# Patient Record
Sex: Male | Born: 1975 | Race: White | Hispanic: No | Marital: Single | Smoking: Former smoker
Health system: Southern US, Community
[De-identification: ages and names within clinical notes are randomized; demographics above are authoritative.]

## PROBLEM LIST (undated history)

## (undated) DIAGNOSIS — F200 Paranoid schizophrenia: Secondary | ICD-10-CM

## (undated) HISTORY — PX: ORBITAL FRACTURE SURGERY: SHX725

## (undated) HISTORY — PX: CHOLECYSTECTOMY: SHX55

---

## 2007-09-16 ENCOUNTER — Other Ambulatory Visit: Payer: Self-pay

## 2007-09-16 ENCOUNTER — Ambulatory Visit: Payer: Self-pay | Admitting: Psychiatry

## 2007-09-16 ENCOUNTER — Inpatient Hospital Stay (HOSPITAL_COMMUNITY): Admission: AD | Admit: 2007-09-16 | Discharge: 2007-09-24 | Payer: Self-pay | Admitting: Psychiatry

## 2008-12-28 ENCOUNTER — Inpatient Hospital Stay (HOSPITAL_COMMUNITY): Admission: AD | Admit: 2008-12-28 | Discharge: 2009-01-03 | Payer: Self-pay | Admitting: Psychiatry

## 2008-12-28 ENCOUNTER — Ambulatory Visit: Payer: Self-pay | Admitting: Psychiatry

## 2009-05-29 ENCOUNTER — Emergency Department (HOSPITAL_COMMUNITY): Admission: EM | Admit: 2009-05-29 | Discharge: 2009-05-29 | Payer: Self-pay | Admitting: Emergency Medicine

## 2010-09-17 IMAGING — CR DG CHEST 1V PORT
1 series · 1 of 1 positions shown · non-contrast
Comparison: None.

CLINICAL DATA: Intoxication, vomiting, unresponsive

PORTABLE CHEST - 1 VIEW

[view not recorded]
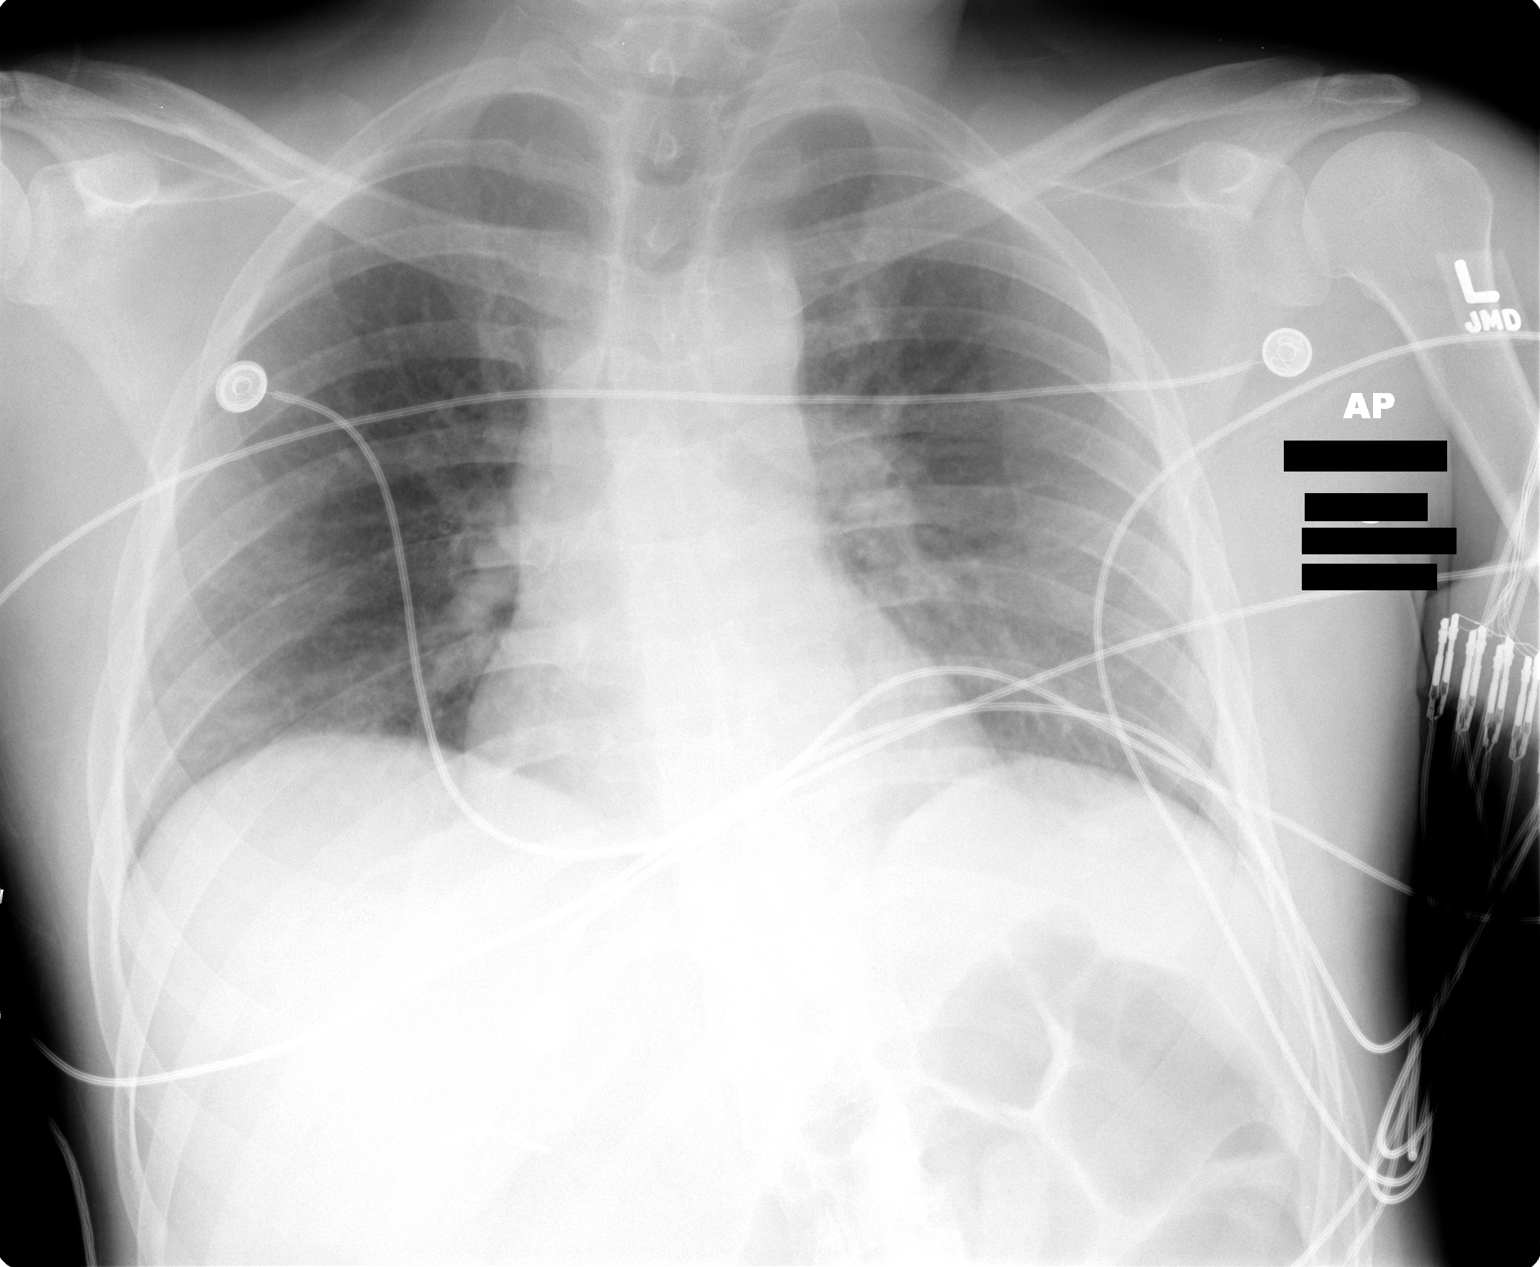

[1 of 1 positions shown; findings below may reference images not displayed]

FINDINGS: Low lung volumes with vascular crowding and basilar
atelectasis.  No focal pneumonia, consolidation, effusion or
pneumothorax.  Previous cholecystectomy.
IMPRESSION: Low volume exam with atelectasis

## 2011-01-27 LAB — COMPREHENSIVE METABOLIC PANEL
ALT: 48 U/L (ref 0–53)
AST: 49 U/L — ABNORMAL HIGH (ref 0–37)
Albumin: 4 g/dL (ref 3.5–5.2)
Alkaline Phosphatase: 87 U/L (ref 39–117)
BUN: 2 mg/dL — ABNORMAL LOW (ref 6–23)
CO2: 24 mEq/L (ref 19–32)
Calcium: 8.2 mg/dL — ABNORMAL LOW (ref 8.4–10.5)
Chloride: 102 mEq/L (ref 96–112)
Creatinine, Ser: 0.82 mg/dL (ref 0.4–1.5)
GFR calc Af Amer: >60 mL/min (ref >60)
GFR calc non Af Amer: >60 mL/min (ref >60)
Glucose, Bld: 132 mg/dL — ABNORMAL HIGH (ref 70–99)
Potassium: 3.2 mEq/L — ABNORMAL LOW (ref 3.5–5.1)
Sodium: 136 mEq/L (ref 135–145)
Total Bilirubin: 0.6 mg/dL (ref 0.3–1.2)
Total Protein: 6.8 g/dL (ref 6.0–8.3)

## 2011-01-27 LAB — RAPID URINE DRUG SCREEN, HOSP PERFORMED
Amphetamines: NOT DETECTED
Barbiturates: NOT DETECTED
Benzodiazepines: NOT DETECTED
Cocaine: NOT DETECTED
Opiates: NOT DETECTED
Tetrahydrocannabinol: NOT DETECTED

## 2011-01-27 LAB — ETHANOL: Alcohol, Ethyl (B): 248 mg/dL — ABNORMAL HIGH (ref 0–10)

## 2011-02-01 LAB — CBC
MCHC: 34.4 g/dL (ref 30.0–36.0)
MCV: 88.2 fL (ref 78.0–100.0)
Platelets: 245 10*3/uL (ref 150–400)
RBC: 5.08 MIL/uL (ref 4.22–5.81)
WBC: 7.7 10*3/uL (ref 4.0–10.5)

## 2011-02-01 LAB — URINALYSIS, ROUTINE W REFLEX MICROSCOPIC
Hgb urine dipstick: NEGATIVE
Nitrite: NEGATIVE
Protein, ur: NEGATIVE mg/dL
Specific Gravity, Urine: 1.006 (ref 1.005–1.030)
Urobilinogen, UA: 0.2 mg/dL (ref 0.0–1.0)

## 2011-02-01 LAB — COMPREHENSIVE METABOLIC PANEL
ALT: 31 U/L (ref 0–53)
AST: 22 U/L (ref 0–37)
Albumin: 3.9 g/dL (ref 3.5–5.2)
CO2: 28 mEq/L (ref 19–32)
Calcium: 9.4 mg/dL (ref 8.4–10.5)
Chloride: 107 mEq/L (ref 96–112)
Creatinine, Ser: 0.84 mg/dL (ref 0.4–1.5)
GFR calc Af Amer: >60 mL/min (ref >60)
GFR calc non Af Amer: >60 mL/min (ref >60)
Sodium: 141 mEq/L (ref 135–145)

## 2011-02-01 LAB — DRUGS OF ABUSE SCREEN W/O ALC, ROUTINE URINE
Cocaine Metabolites: NEGATIVE
Creatinine,U: 46.7 mg/dL
Marijuana Metabolite: NEGATIVE
Opiate Screen, Urine: NEGATIVE
Propoxyphene: NEGATIVE

## 2011-03-06 NOTE — H&P (Signed)
NAMEMANOAH, [name]                  ACCOUNT NO.:  [id number]   MEDICAL RECORD NO.:  [id number]          PATIENT TYPE:  IPS   LOCATION:  0404                          FACILITY:  [facility]   PHYSICIAN:  Xavier Jungling, MD  DATE OF BIRTH:  20-Jul-1976   DATE OF ADMISSION:  09/16/2007  DATE OF DISCHARGE:                       PSYCHIATRIC ADMISSION ASSESSMENT   IDENTIFICATION:  35 year old white male who is single.  This is an  involuntary admission.   HISTORY OF PRESENT ILLNESS:  This patient was petitioned by his mother  who was concerned about symptoms of paranoia and had complained of him  abusing substances including whiteout and ether, which he has  acknowledged doing.  The patient is single and living alone with his  mother here in [place], [place]. Has a lengthy history of  polysubstance abuse and was first diagnosed with schizophrenia in 2004  in [place].  He has a history of polysubstance abuse starting  with alcohol around age 89.  He denies any hallucinations today and has  been cooperative with staff.   PAST PSYCHIATRIC HISTORY:  The patient is followed by Dr. Hortencia Wilson at  [facility] mental health and by the ACT Team, previously diagnosed  schizophrenia, paranoid type when he was in the [place],  has a  history of polysubstance abuse, history of paranoia and hallucinations.  He reports he is currently compliant with medications.   SOCIAL HISTORY:  Single white male on disability currently living with  his mother.   MEDICAL HISTORY:  Primary care [name] is not known.  Medical problems  are no chronic medical problems.  He was notably hypokalemic 3.1 in the  emergency room which was repleted with 40 mEq of K-Dur.   CURRENT MEDICATIONS:  Are Seroquel 100 mg p.o. q.h.s., Risperdal 1 mg at  2:00 p.m., 5 mg p.o. q.h.s., Flexeril 10 mg 1/2 tablet b.i.d. p.r.n. for  muscle spasms.   PHYSICAL EXAM:  Done in the emergency room.   DIAGNOSTIC  STUDIES:  Unremarkable other than his potassium at 3.1.  His  urine drug screen was negative for all substances.   MENTAL STATUS EXAM:  Revealed a fully alert gentleman cooperative,  bright affect and not guarded, somewhat resistant to interview and gave  some contradictory responses regarding his compliance.  At one point  denied current drug abuse and then admitted that he was periodically  using ether and other substances whenever he could get them but was  vague about when last used.  No overtly delusional statements.  Mood is  neutral, accepts being here and has been cooperative.  Cognition is  preserved today, fully alert, oriented x4.  Insight seems adequate.  Reports he is here because his mother insisted on it.   AXIS I:  Psychosis NOS.  Polysubstance abuse.  AXIS II:  No diagnosis.  AXIS III:  No diagnosis.  AXIS IV:  Deferred.  AXIS V:  Current 49 past year not known postop hyponatremia improved  also.  No postop hypokalemia improved.   PLAN:  Is to  involuntarily admit the patient with q.  15-minute checks  in place.  Will continue his usual medications and have placed him on  our intensive care unit for close observation.  We will attempt to get  additional history from his family.   Estimated length of stay is 3-4 days.      [name], N.P.      Xavier Jungling, MD  Electronically Signed    MAS/MEDQ  D:  09/17/2007  T:  09/17/2007  Job:  [id number]

## 2011-03-06 NOTE — H&P (Signed)
[name], [name]                  ACCOUNT NO.:  [id number]   MEDICAL RECORD NO.:  [id number]          PATIENT TYPE:  IPS   LOCATION:  0508                          FACILITY:  BH   PHYSICIAN:  Anselm Jungling, MD  DATE OF BIRTH:  1976-07-28   DATE OF ADMISSION:  12/28/2008  DATE OF DISCHARGE:                       PSYCHIATRIC ADMISSION ASSESSMENT   The patient is a 35 year old male voluntarily admitted.   HISTORY OF PRESENT ILLNESS:  The patient presents with a history of  altered mental status.  He was recommended to come here by his ACT team  psychiatrist after the patient had been using a substance called K2, a  synthetic marijuana, that the patient states you spray on any kind of  herb.  He states he has been high for the past 5 days.  He states he  likes the way this substance works for him, gives him a lot of energy.  He feels like he is less confused than he has been.  He denies any  suicidal or homicidal thoughts.  He denies any hallucinations at this  time.   PAST PSYCHIATRIC HISTORY:  The patient was here in 2008 for paranoia and  substance use.  He sees Dr. Electa Sniff on the ACT team.   SOCIAL HISTORY:  A 35 year old male who lives with his parents in  [place], [place].  He has 14 years of schooling.  He was  studying botany.  He is unemployed and has a past history of being in  prison for smuggling drugs.   FAMILY HISTORY:  None.   ALCOHOL AND DRUG HISTORY:  The patient smokes cigarettes and has been  using a substance called K2 recently.  He has a history of marijuana use  and cocaine use in the past.   PRIMARY CARE Jlynn Ly:  Theressa Millard, M.D.   MEDICAL PROBLEMS:  He denies any acute chronic health issues.  No major  medical problems.   MEDICATIONS:  1. Has been on Risperdal 3 mg b.i.d.  2. Cogentin 0.5 mg b.i.d.  (Reports compliance with his medications.  States that his mother  administers his medications to him.)   DRUG ALLERGIES:  No known  allergies.   PHYSICAL EXAMINATION:  GENERAL:  This is a young, well-nourished male in  no acute distress.  VITAL SIGNS:  His temperature is 97.2, heart rate 72, respirations 16,  blood pressure is 110/74.  He is 5 feet 7 inches tall, 166 pounds.  HEENT:  Head is atraumatic.  Negative lymphadenopathy.  CHEST:  Clear.  BREAST EXAM:  Deferred.  HEART:  Regular rate and rhythm.  No murmurs, gallops or rubs.  ABDOMEN:  Soft, flat, nontender, nondistended abdomen.  PELVIC/GU:  Exam deferred.  EXTREMITIES:  Moves all extremities, 5+ against resistance.  SKIN:  Warm and dry.  NEUROLOGIC:  Intact and nonfocal.   MENTAL STATUS EXAM:  This is a fully alert, cooperative male who is  casually dressed.  He has fair eye contact.  His speech is soft-spoken,  polite and normal pace.  Mood is neutral.  Affect - again, the patient  is polite.  He sometimes appears to have some inappropriate smiling, as  if he is internally distracted, although he denies any hallucinations.  Thought processes are somewhat scattered.  He talks about his use of  substances.  Offers information about substances.  No delusional  statements.  Cognitive function intact.  His memory appears intact.  Judgment is fair.  Insight is minimal.  The patient seems to imply that  he was still using his substance after discharge.   LABORATORY DATA:  CMET within normal limits.  CBC within normal limits.  Urine drug screen is negative.  Urinalysis is negative.   IMPRESSION:  AXIS I:  Schizophrenia, paranoid type, and substance use.  AXIS II:  Deferred.  AXIS III:  No known medical conditions.  AXIS IV:  Psychosocial problems related to chronic substance use.  Problems with occupation and possible problems with education.  AXIS V:  Current is 50.   PLAN:  Plan is to put the patient in the dual diagnosis group.  Will  resume his Risperdal and Cogentin.  Will have a family session with his  mother for baseline and discharge concerns.   Work on relapse prevention,  as well.  His tentative length of stay at this time is 2-3 days.      [name], N.P.      Anselm Jungling, MD  Electronically Signed    JO/MEDQ  D:  12/29/2008  T:  12/29/2008  Job:  [id number]

## 2011-03-09 NOTE — Discharge Summary (Signed)
NAMEABSHIR, PAOLINI NO.:  [id number]   MEDICAL RECORD NO.:  [id number]          PATIENT TYPE:  IPS   LOCATION:  0508                          FACILITY:  BH   PHYSICIAN:  Anselm Jungling, MD  DATE OF BIRTH:  08/13/1976   DATE OF ADMISSION:  12/28/2008  DATE OF DISCHARGE:  01/03/2009                               DISCHARGE SUMMARY   IDENTIFYING DATA/REASON FOR ADMISSION:  This is the third BAC admission  for [name], 35 year old single Caucasian male who lives with his mother  in [place], [place].  He is a client of the [facility]  assertive community treatment team.  He comes to [place] with a history of  major mental illness, including psychotic disorder, and history of  substance abuse.  He is admitted on this occasion because of substance  abuse relapse and associated decline in functioning.  Please refer to  the admission note for further details pertaining to the symptoms,  circumstances and history that led to his hospitalization.  He was given  an initial Axis I diagnosis of schizophrenia, chronic undifferentiated  type, acute exacerbation and polysubstance abuse.   MEDICAL AND LABORATORY:  The patient is in good health without any  active or chronic medical problems.  He was medically and physically  assessed by the psychiatric nurse practitioner.  There were no  significant medical issues.   HOSPITAL COURSE:  The patient was admitted to the adult inpatient  psychiatric service.  He presented as a well-nourished, normally-  developed adult male who readily admitted to recent use of supposedly  legal substance known as K2, which is apparently a form of non-  psychotropic herbs that are treated or spiked with a cannabinol  synthetic.  Jeffey admitted to using this during the week previously.  He had also been consuming some alcohol.  As a result, he had been  spending increasing amounts of time sleeping, was unable to function,  which was readily  apparent to his mother.   Nunzio had previously been on a regimen of Risperdal 3 mg daily, and  Cogentin 0.5 mg twice daily.  He had been compliant with this medication  prior to admission.   The patient was involved in the therapeutic milieu, and he attended  various therapeutic groups and activities.  He gave consent to  involvement of both his mother and father (divorced), including a family  meeting that occurred during his stay.   The patient also gave consent to the treatment team communicating with  the assertive community treatment team, [facility] outpatient [facility]  group.   Reinhard indicated in the early part of his stay that he still felt high  from the substance that he had been using, although overtly, his speech  was normally organized and his mood was neutral.  He simply looked very  tired.  He rationalized the drug use he had been doing by stating that  this was a substance that was legal.  He was able to acknowledge  reluctantly that it was not in his best interest to continue using this  substance.   He was also able to see readily that his having relapsed on even of  legal substance was something that alarmed his parents, and damaged  trust, that he had recently been working to rebuild with them.   Once hospitalized, his parents felt it necessary to explore Steffon's  room at home, and in doing so, they found evidence of him having a  firearm.  Cheston later admitted to having bought this firearm  surreptitiously with money that he had saved.  He also bought a  significant amount of ammunition, and had been going to a local gun  range where he was practicing target shooting.  All of these matters  were discussed at length in the family meeting.   At the time of discharge, a new living situation had been determined for  Doctor, namely, living with his father and stepmother, at least for the  time-being.  Izak was to continue his follow-up with the assertive   community treatment team and medications as he had been taking  previously.  He agreed not to use any illicit substances, legal or  illegal.   AFTERCARE:  As above.  The assertive community treatment team was to  begin resuming their work with them on the day of discharge, January 03, 2009 at 11:00 a.m..   DISCHARGE MEDICATIONS:  1. Risperdal 3 mg at q.h.s.  2. Cogentin 0.5 mg b.i.d.   DISCHARGE DIAGNOSES:  AXIS I:  Schizophrenia, chronic undifferentiated  type, and substance abuse NOS.  AXIS II:  Deferred.  AXIS III:  No acute or chronic illnesses.  AXIS IV:  Stressors severe.  AXIS V:  GAF on discharge 50.      Anselm Jungling, MD  Electronically Signed     SPB/MEDQ  D:  01/04/2009  T:  01/04/2009  Job:  [phone]

## 2011-03-09 NOTE — Discharge Summary (Signed)
NAMEMARKEISE, MATHEWS                  ACCOUNT NO.:  [id number]   MEDICAL RECORD NO.:  [id number]          PATIENT TYPE:  IPS   LOCATION:  0404                          FACILITY:  BH   PHYSICIAN:  Anselm Jungling, MD  DATE OF BIRTH:  March 22, 1976   DATE OF ADMISSION:  09/16/2007  DATE OF DISCHARGE:  09/24/2007                               DISCHARGE SUMMARY   IDENTIFYING DATA AND REASON FOR ADMISSION:  This was an inpatient  psychiatric admission for [name], a 35 year old single white male.  He  had been petitioned by his mother concerning increasing symptoms of  paranoia, associated with substance abuse.  Please refer to the  admission note for further details pertaining to the symptoms,  circumstances and history that led to his hospitalization.  He was given  an initial Axis I diagnosis of psychosis NOS and polysubstance abuse.   MEDICAL AND LABORATORY:  The patient was in good health without any  active or chronic medical problems.  He was medically and physically  assessed by the psychiatric nurse practitioner.  His urine drug screen  was negative.  His initial potassium was somewhat low at 3.1, but this  was not felt to be reflective of any underlying physiological or medical  disturbance.  He had a history of muscle spasm and had been taking  Flexeril for this.  There were no acute medical issues.   HOSPITAL COURSE:  The patient was admitted to the adult inpatient  psychiatric service.  He presented as a well-nourished, well-developed  gentleman with bright affect, who was fairly open, but gave some  contradictory responses regarding his previous treatment compliance.  He  was also contradictory regarding recent drug abuse.  He eventually  admitted to huffing various inhalant substances.   He came to [place] with a long history of cannabis abuse.  He also has a  history of prior psychiatric hospitalizations in other states, with  previous diagnoses of schizophrenia.   The patient  was treated with a regimen of Risperdal and Seroquel.  He  was not a particularly good participant in the treatment program,  especially initially, during which time he spent most of his time in  bed.  However, he was generally pleasant and cooperative.   A good deal of effort was directed towards establishing a reasonable  aftercare plan for [place].  He had only recently returned to this  geographic area, where his mother lives.  It was not clear whether he  could live with his mother, due to his general level of impulsivity, and  his tendency to quickly get reinvolved in substance abuse.  However, the  patient ultimately agreed to a plan in which he would be followed by the  Assertive Community Treatment team, while living with his mother.   The patient's mental status cleared quite well during the course of his  inpatient stay, and at the time of discharge, he was absent of any overt  signs of psychosis or thought disorder.  His mood was generally good,  pleasant and upbeat.  He had some positive contacts with  his mother, who  was enormously supportive.  He agreed to the following aftercare plan.   AFTERCARE:  The patient was to follow-up with the Assertive Community  Treatment team of [place], in conjunction with the [facility].  The patient was to be seen on the day following discharge, September 25, 2007, by the ACT team.   DISCHARGE MEDICATIONS:  Risperdal 1 mg at noon, and 5 mg q.h.s.,  Seroquel 100 mg q.h.s., and Flexeril 5 mg up to b.i.d. as needed for  muscle spasm.  The patient was instructed to follow-up with his primary  care physician for routine medical care.   DISCHARGE DIAGNOSES:  AXIS I: Psychosis, not otherwise specified,  resolving. History of substance abuse, not otherwise specified.  AXIS II: Deferred.  AXIS III: No acute or chronic illnesses.  AXIS IV: Stressors severe.  AXIS V: GAF on discharge 45.      Anselm Jungling, MD  Electronically  Signed     SPB/MEDQ  D:  10/09/2007  T:  10/10/2007  Job:  [id number]

## 2011-07-31 LAB — COMPREHENSIVE METABOLIC PANEL
AST: 11
CO2: 28
Calcium: 8.8
Creatinine, Ser: 0.81
GFR calc Af Amer: >60
GFR calc non Af Amer: >60
Sodium: 140
Total Protein: 6

## 2011-07-31 LAB — RAPID URINE DRUG SCREEN, HOSP PERFORMED
Amphetamines: NOT DETECTED
Cocaine: NOT DETECTED
Opiates: NOT DETECTED
Tetrahydrocannabinol: NOT DETECTED

## 2011-07-31 LAB — DIFFERENTIAL
Eosinophils Relative: 2
Lymphocytes Relative: 40
Lymphs Abs: 2.5
Monocytes Relative: 9

## 2011-07-31 LAB — URINALYSIS, ROUTINE W REFLEX MICROSCOPIC
Bilirubin Urine: NEGATIVE
Ketones, ur: NEGATIVE
Nitrite: NEGATIVE
Protein, ur: NEGATIVE
Urobilinogen, UA: 0.2

## 2011-07-31 LAB — CBC
MCHC: 35.5
MCV: 86.8
Platelets: 233
RDW: 13.5

## 2013-01-20 ENCOUNTER — Encounter (HOSPITAL_COMMUNITY): Payer: Self-pay | Admitting: Emergency Medicine

## 2013-01-20 ENCOUNTER — Observation Stay (HOSPITAL_COMMUNITY)
Admission: EM | Admit: 2013-01-20 | Discharge: 2013-01-23 | Payer: Medicare Other | Attending: Internal Medicine | Admitting: Internal Medicine

## 2013-01-20 DIAGNOSIS — I498 Other specified cardiac arrhythmias: Secondary | ICD-10-CM | POA: Insufficient documentation

## 2013-01-20 DIAGNOSIS — R030 Elevated blood-pressure reading, without diagnosis of hypertension: Secondary | ICD-10-CM | POA: Insufficient documentation

## 2013-01-20 DIAGNOSIS — T413X5A Adverse effect of local anesthetics, initial encounter: Secondary | ICD-10-CM | POA: Insufficient documentation

## 2013-01-20 DIAGNOSIS — T413X1A Poisoning by local anesthetics, accidental (unintentional), initial encounter: Principal | ICD-10-CM | POA: Insufficient documentation

## 2013-01-20 DIAGNOSIS — Y92009 Unspecified place in unspecified non-institutional (private) residence as the place of occurrence of the external cause: Secondary | ICD-10-CM | POA: Insufficient documentation

## 2013-01-20 DIAGNOSIS — F209 Schizophrenia, unspecified: Secondary | ICD-10-CM

## 2013-01-20 DIAGNOSIS — E876 Hypokalemia: Secondary | ICD-10-CM | POA: Insufficient documentation

## 2013-01-20 DIAGNOSIS — K219 Gastro-esophageal reflux disease without esophagitis: Secondary | ICD-10-CM | POA: Insufficient documentation

## 2013-01-20 DIAGNOSIS — IMO0002 Reserved for concepts with insufficient information to code with codable children: Secondary | ICD-10-CM | POA: Diagnosis present

## 2013-01-20 DIAGNOSIS — D72829 Elevated white blood cell count, unspecified: Secondary | ICD-10-CM | POA: Insufficient documentation

## 2013-01-20 DIAGNOSIS — R000 Tachycardia, unspecified: Secondary | ICD-10-CM | POA: Diagnosis present

## 2013-01-20 DIAGNOSIS — F1910 Other psychoactive substance abuse, uncomplicated: Secondary | ICD-10-CM | POA: Insufficient documentation

## 2013-01-20 DIAGNOSIS — I10 Essential (primary) hypertension: Secondary | ICD-10-CM | POA: Diagnosis present

## 2013-01-20 DIAGNOSIS — Z79899 Other long term (current) drug therapy: Secondary | ICD-10-CM | POA: Insufficient documentation

## 2013-01-20 DIAGNOSIS — W268XXA Contact with other sharp object(s), not elsewhere classified, initial encounter: Secondary | ICD-10-CM | POA: Insufficient documentation

## 2013-01-20 DIAGNOSIS — G929 Unspecified toxic encephalopathy: Secondary | ICD-10-CM | POA: Insufficient documentation

## 2013-01-20 DIAGNOSIS — G92 Toxic encephalopathy: Secondary | ICD-10-CM | POA: Insufficient documentation

## 2013-01-20 DIAGNOSIS — T50901A Poisoning by unspecified drugs, medicaments and biological substances, accidental (unintentional), initial encounter: Secondary | ICD-10-CM

## 2013-01-20 DIAGNOSIS — F200 Paranoid schizophrenia: Secondary | ICD-10-CM | POA: Insufficient documentation

## 2013-01-20 DIAGNOSIS — S61209A Unspecified open wound of unspecified finger without damage to nail, initial encounter: Secondary | ICD-10-CM | POA: Insufficient documentation

## 2013-01-20 HISTORY — DX: Paranoid schizophrenia: F20.0

## 2013-01-20 LAB — ACETAMINOPHEN LEVEL: Acetaminophen (Tylenol), Serum: <15 ug/mL (ref 10–30)

## 2013-01-20 LAB — CBC
HCT: 45 % (ref 39.0–52.0)
Hemoglobin: 16 g/dL (ref 13.0–17.0)
MCHC: 35.6 g/dL (ref 30.0–36.0)
MCV: 85.1 fL (ref 78.0–100.0)
WBC: 15.3 10*3/uL — ABNORMAL HIGH (ref 4.0–10.5)

## 2013-01-20 LAB — COMPREHENSIVE METABOLIC PANEL
Alkaline Phosphatase: 111 U/L (ref 39–117)
BUN: 7 mg/dL (ref 6–23)
Chloride: 97 mEq/L (ref 96–112)
GFR calc Af Amer: >90 mL/min (ref >90)
Glucose, Bld: 104 mg/dL — ABNORMAL HIGH (ref 70–99)
Potassium: 3.6 mEq/L (ref 3.5–5.1)
Total Bilirubin: 0.8 mg/dL (ref 0.3–1.2)

## 2013-01-20 LAB — RAPID URINE DRUG SCREEN, HOSP PERFORMED: Barbiturates: NOT DETECTED

## 2013-01-20 LAB — ETHANOL: Alcohol, Ethyl (B): <11 mg/dL (ref 0–11)

## 2013-01-20 MED ORDER — SODIUM CHLORIDE 0.9 % IJ SOLN
3.0000 mL | Freq: Two times a day (BID) | INTRAMUSCULAR | Status: DC
  Administered 2013-01-20 – 2013-01-22 (×3): 3 mL via INTRAVENOUS

## 2013-01-20 MED ORDER — PANTOPRAZOLE SODIUM 40 MG PO TBEC
40.0000 mg | DELAYED_RELEASE_TABLET | Freq: Every day | ORAL | Status: DC
  Administered 2013-01-21 – 2013-01-23 (×3): 40 mg via ORAL
  Filled 2013-01-20 (×3): qty 1

## 2013-01-20 MED ORDER — BENZTROPINE MESYLATE 0.5 MG PO TABS
0.5000 mg | ORAL_TABLET | Freq: Every day | ORAL | Status: DC
  Administered 2013-01-21: 0.5 mg via ORAL
  Filled 2013-01-20: qty 1

## 2013-01-20 MED ORDER — TETANUS-DIPHTH-ACELL PERTUSSIS 5-2.5-18.5 LF-MCG/0.5 IM SUSP
0.5000 mL | Freq: Once | INTRAMUSCULAR | Status: AC
  Administered 2013-01-20: 0.5 mL via INTRAMUSCULAR
  Filled 2013-01-20: qty 0.5

## 2013-01-20 MED ORDER — FLUOXETINE HCL 10 MG PO CAPS
10.0000 mg | ORAL_CAPSULE | Freq: Every day | ORAL | Status: DC
  Administered 2013-01-21 – 2013-01-23 (×3): 10 mg via ORAL
  Filled 2013-01-20 (×3): qty 1

## 2013-01-20 MED ORDER — NICOTINE 21 MG/24HR TD PT24
21.0000 mg | MEDICATED_PATCH | Freq: Every day | TRANSDERMAL | Status: DC
  Administered 2013-01-21 – 2013-01-23 (×3): 21 mg via TRANSDERMAL
  Filled 2013-01-20 (×3): qty 1

## 2013-01-20 MED ORDER — SODIUM CHLORIDE 0.9 % IV SOLN
INTRAVENOUS | Status: DC
  Administered 2013-01-20: 22:00:00 via INTRAVENOUS

## 2013-01-20 MED ORDER — IBUPROFEN 600 MG PO TABS
600.0000 mg | ORAL_TABLET | Freq: Three times a day (TID) | ORAL | Status: DC | PRN
  Filled 2013-01-20: qty 1

## 2013-01-20 MED ORDER — ACETAMINOPHEN 325 MG PO TABS: 650.0000 mg | ORAL_TABLET | ORAL | Status: DC | PRN

## 2013-01-20 MED ORDER — RISPERIDONE 1 MG PO TABS
1.0000 mg | ORAL_TABLET | Freq: Every day | ORAL | Status: DC
  Administered 2013-01-20: 1 mg via ORAL
  Filled 2013-01-20 (×2): qty 1

## 2013-01-20 MED ORDER — ALUM & MAG HYDROXIDE-SIMETH 200-200-20 MG/5ML PO SUSP: 30.0000 mL | ORAL | Status: DC | PRN

## 2013-01-20 MED ORDER — LORAZEPAM 1 MG PO TABS
1.0000 mg | ORAL_TABLET | Freq: Three times a day (TID) | ORAL | Status: DC | PRN
  Administered 2013-01-20 – 2013-01-23 (×7): 1 mg via ORAL
  Filled 2013-01-20 (×7): qty 1

## 2013-01-20 MED ORDER — ONDANSETRON HCL 4 MG PO TABS
4.0000 mg | ORAL_TABLET | Freq: Three times a day (TID) | ORAL | Status: DC | PRN
  Administered 2013-01-22: 4 mg via ORAL
  Filled 2013-01-20: qty 1
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
## 2013-01-20 NOTE — ED Notes (Signed)
Report given to floor RN, however per [facility] and charge RN we are keeping the pt in department until shift change due to no sitter available.

## 2013-01-20 NOTE — Progress Notes (Signed)
Pt confirms pcp is  [name] EPIC updated

## 2013-01-20 NOTE — ED Notes (Addendum)
States that he drank 3 bottles of benzocaine. Poison control notified and recommend the following.  Hemoglobinemia--cyanosis of lips, neck , chest, monitor for seizures, tachycardia, and irregular HR, recommend EKG, cardiac montioring

## 2013-01-20 NOTE — ED Provider Notes (Signed)
Medical screening examination/treatment/procedure(s) were performed by non-physician practitioner and as supervising physician I was immediately available for consultation/collaboration.   Loren Racer, MD 01/20/13 [phone]
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
## 2013-01-20 NOTE — Progress Notes (Signed)
RN spoke with patients mother.  Pt mother concerned that pt may not be sent to a facility and says the he needs to be stabilized at a facility before he goes home.  She is also in contact with restarting her son with ACT team.

## 2013-01-20 NOTE — ED Notes (Signed)
Mother gone to magistrate to initiate IVC order

## 2013-01-21 DIAGNOSIS — E876 Hypokalemia: Secondary | ICD-10-CM | POA: Diagnosis not present

## 2013-01-21 DIAGNOSIS — K219 Gastro-esophageal reflux disease without esophagitis: Secondary | ICD-10-CM | POA: Diagnosis present

## 2013-01-21 LAB — BASIC METABOLIC PANEL
BUN: 5 mg/dL — ABNORMAL LOW (ref 6–23)
CO2: 25 mEq/L (ref 19–32)
Calcium: 9.2 mg/dL (ref 8.4–10.5)
Creatinine, Ser: 0.87 mg/dL (ref 0.50–1.35)
Glucose, Bld: 114 mg/dL — ABNORMAL HIGH (ref 70–99)
Sodium: 134 mEq/L — ABNORMAL LOW (ref 135–145)

## 2013-01-21 LAB — CBC
HCT: 40.3 % (ref 39.0–52.0)
Hemoglobin: 14 g/dL (ref 13.0–17.0)
MCH: 29.7 pg (ref 26.0–34.0)
MCHC: 34.7 g/dL (ref 30.0–36.0)
MCV: 85.4 fL (ref 78.0–100.0)
RBC: 4.72 MIL/uL (ref 4.22–5.81)

## 2013-01-21 MED ORDER — POTASSIUM CHLORIDE CRYS ER 20 MEQ PO TBCR
20.0000 meq | EXTENDED_RELEASE_TABLET | Freq: Two times a day (BID) | ORAL | Status: AC
  Administered 2013-01-21 (×2): 20 meq via ORAL
  Filled 2013-01-21 (×3): qty 1

## 2013-01-21 MED ORDER — BENZTROPINE MESYLATE 1 MG PO TABS
1.0000 mg | ORAL_TABLET | Freq: Two times a day (BID) | ORAL | Status: DC
  Administered 2013-01-21 – 2013-01-23 (×4): 1 mg via ORAL
  Filled 2013-01-21 (×6): qty 1

## 2013-01-21 MED ORDER — RISPERIDONE 2 MG PO TABS
2.0000 mg | ORAL_TABLET | Freq: Every day | ORAL | Status: DC
  Administered 2013-01-21 – 2013-01-22 (×2): 2 mg via ORAL
  Filled 2013-01-21 (×4): qty 1
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
## 2013-01-21 NOTE — Progress Notes (Signed)
Dr. Elsie Saas given patient's mother's cell phone number to talk about her son.
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
## 2013-01-21 NOTE — Progress Notes (Signed)
Psych doctor, please call patient's mother Wynona Canes on her cell phone after you see the patient. Thanks!

## 2013-01-22 LAB — BASIC METABOLIC PANEL
BUN: 4 mg/dL — ABNORMAL LOW (ref 6–23)
CO2: 28 mEq/L (ref 19–32)
Calcium: 9.4 mg/dL (ref 8.4–10.5)
Chloride: 100 mEq/L (ref 96–112)
Creatinine, Ser: 0.89 mg/dL (ref 0.50–1.35)
GFR calc Af Amer: >90 mL/min (ref >90)
GFR calc non Af Amer: >90 mL/min (ref >90)
Glucose, Bld: 92 mg/dL (ref 70–99)
Potassium: 3.7 mEq/L (ref 3.5–5.1)
Sodium: 138 mEq/L (ref 135–145)

## 2013-01-22 MED ORDER — ACETAMINOPHEN 325 MG PO TABS: 650.0000 mg | ORAL_TABLET | ORAL | Status: AC | PRN

## 2013-01-22 MED ORDER — NICOTINE 21 MG/24HR TD PT24: 30.0000 | MEDICATED_PATCH | Freq: Every day | TRANSDERMAL | Status: AC

## 2013-01-22 MED ORDER — LORAZEPAM 1 MG PO TABS: 1.0000 mg | ORAL_TABLET | Freq: Three times a day (TID) | ORAL | Status: AC | PRN

## 2013-01-22 NOTE — Discharge Summary (Signed)
Physician Discharge Summary  NAME:[name]  ZOX:[id number]  DOB: 1976/10/09   Admit date: 01/20/2013 Discharge date: 01/22/2013  Admitting Diagnosis: overdose  Discharge Diagnoses:  Active Hospital Problems   Diagnosis Date Noted  . 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 Hypokalemia 01/21/2013  . GERD (gastroesophageal reflux disease) 01/21/2013  . Polysubstance abuse 01/20/2013  . Laceration left index finger 01/20/2013    Resolved Hospital Problems   Diagnosis Date Noted Date Resolved  . Sinus tachycardia 01/20/2013 01/22/2013  . Leukocytosis 01/20/2013 01/21/2013  . High blood pressure 01/20/2013 01/22/2013    Discharge Condition: improved  Hospital Course: Patient was admitted after overdose of Orajel. When asked why he took it, he said "to get high." He had tachycardia and high blood pressure but no evidence of methemoglobinemia. He is now stable for transfer to inpatient psychiatric unit for stabilization.   He was seen in consultation by psych CSW and Dr. Elsie Saas and inpatient psych stay was deemed appropriate and necessary for patient stabilization and safety.  He has a minor laceration on his finger that only needs to be kept clean and dry.   Consults: psychiatry.   Disposition:   Discharge Orders   Future Orders Complete By Expires     Diet general  As directed         Medication List    STOP taking these medications       BENZOCAINE (TOPICAL) 20 % Liqd     MUCUS RELIEF DM 20-400 MG Tabs  Generic drug:  Dextromethorphan-Guaifenesin      TAKE these medications       acetaminophen 325 MG tablet  Commonly known as:  TYLENOL  Take 2 tablets (650 mg total) by mouth every 4 (four) hours as needed.     benztropine 0.5 MG tablet  Commonly known as:  COGENTIN  Take 0.5 mg by mouth daily.     FLUoxetine 10 MG capsule  Commonly known as:  PROZAC  Take 10 mg by mouth daily.     LORazepam 1 MG  tablet  Commonly known as:  ATIVAN  Take 1 tablet (1 mg total) by mouth every 8 (eight) hours as needed for anxiety (agitation).     nicotine 21 mg/24hr patch  Commonly known as:  NICODERM CQ - dosed in mg/24 hours  Place 30 patches onto the skin daily.     omeprazole 20 MG capsule  Commonly known as:  PRILOSEC  Take 20 mg by mouth daily.     risperiDONE 1 MG tablet  Commonly known as:  RISPERDAL  Take 1 mg by mouth at bedtime.     risperiDONE microspheres 25 MG injection  Commonly known as:  RISPERDAL CONSTA  Inject 25 mg into the muscle every 14 (fourteen) days.          Time coordinating discharge: 20 minutes including medication reconciliation, preparation of discharge papers, and discussion with patient     Signed: Darnelle Bos 01/22/2013, 7:16 AM

## 2013-01-22 NOTE — Progress Notes (Addendum)
Clinical Social Work Progress Note PSYCHIATRY SERVICE LINE 01/22/2013  Patient:  KAYDAN WONG  Account:  [id number]  Admit Date:  01/20/2013  Clinical Social Worker:  Unk Lightning, LCSW  Date/Time:  01/22/2013 11:00 AM  Review of Patient  Overall Medical Condition:   MD has dc patient and is medically stable to dc to inpatient facility.   Participation Level:  Active  Participation Quality  Attentive   Other Participation Quality:   Affect  Appropriate   Cognitive  Alert   Reaction to Medications/Concerns:   None reported   Modes of Intervention  Support  Solution-Focused   Summary of Progress/Plan at Discharge   CSW met with patient at bedside. Patient sitting in bed with sitter present. Patient reports he slept well last night and is feeling better this morning. Patient reports he spoke with psych MD and is aware of referral for inpatient hospitalization and patient is agreeable. Patient remains to have pressured speech throughout session.    CSW spoke with mom as well. Mom was worried that inpatient hospitalization was not recommended but CSW assured mom that it was and explained process. CSW explained that local facilities would be search first.    CSW sent referrals to the following facilities who report no beds available but will keep referral:    [facility]  [facility]  [facility]    CSW expanded the search to [facility] and [facility]. [facility] reports available beds but they need to review referral. Referral sent to [facility] who reports available beds but need to review referral. CSW will continue to follow to assist with placement.    MD has completed FMLA paperwork for mom and placed on chart. Mom aware and will pick up forms this afternoon. CSW called [facility] and spoke with team member regarding accepting patient back to service when medically stable. CSW awaiting a return call from [facility] to discuss details.

## 2013-01-22 NOTE — BHH Counselor (Signed)
Unk Lightning, Child psychotherapist at [employer], submitted Pt for admission to [facility]. Thurman Coyer, [facility] confirmed that patient will require a high-acuity, 400-hall bed which is not currently available. Patient will be reviewed again when a bed is available.  Harlin Rain Patsy Baltimore, LPC, [facility] Assessment Counselor

## 2013-01-22 NOTE — Progress Notes (Signed)
Clinical Social Work  Patient denied by [facility] due to needing a facility that can treat dual diagnosis and [facility] reports they cannot properly treat patient. CSW continues to wait for determinations for other referrals.  [place], [place] [phone]

## 2013-01-22 NOTE — Progress Notes (Signed)
Clinical Social Work  CSW spoke with [facility] who reports information has been reviewed but no appropriate beds for patient at facility currently. Referral will be kept and CSW will be contacted when appropriate bed becomes available.  [place], [place] [phone]

## 2013-01-22 NOTE — Progress Notes (Signed)
Clinical Social Work  CSW called [facility] to check on status of patient. [facility] reports patient information still being reviewed.  CSW spoke with [facility] who reports that patient has been accepted but is on waiting list due to no beds available at this time. [facility] reports they will call when they are ready to accept patient. [facility] has [facility] unit number if dc occurs today. CSW called Sheriff who reports they only answer transport calls until 7pm. [facility] aware of transportation hours and reports they will hold bed until tomorrow if one is to become available. CSW informed charge RN of possible transfer and can call Sheriff at [phone] if transportation is needed.  CSW will continue to follow and will follow to see if [facility] can accept patient.  [place], [place] [phone]

## 2013-01-22 NOTE — Progress Notes (Signed)
Visited with patient at request of nursing staff. He was very interested in meeting "Kinnie Scales" my assistance dog.  Offered support for his continuing recovery.

## 2013-01-22 NOTE — Progress Notes (Signed)
Followed up with patients mother and POA- [name] regarding her reported concern with psychiatry care.  I reassured the mother that psychiatry is appropriately following her son's care and made her aware of psychiatry recommendations of medication management and inpatient psychiatric hospitalization after medically cleared.  No further issues or complaints per the mother, has followed up with [facility] CSW and is satisfied with plan of care.  Mother given my name and contact number for future reference.

## 2013-01-22 NOTE — Progress Notes (Signed)
Clinical Social Work  CSW received call from [facility] reporting that patient was declined due to too high of acuity. CSW will continue to follow.  [place], [place] [phone]

## 2013-01-23 ENCOUNTER — Encounter (HOSPITAL_COMMUNITY): Payer: Self-pay | Admitting: *Deleted

## 2013-01-23 ENCOUNTER — Inpatient Hospital Stay (HOSPITAL_COMMUNITY): Admission: EM | Admit: 2013-01-23 | Payer: Medicare Other | Source: Intra-hospital | Admitting: Psychiatry

## 2013-01-23 NOTE — Progress Notes (Signed)
Assessment/Plan: Principal Problem:   Toxic encephalopathy from drug ingestion in the setting of paranoid schizophrenia - all medical issues resolved. Discharge papers done and discharge order is EPIC for when bed is available.   Active Problems:   Polysubstance abuse   Laceration left index finger   Hypokalemia   GERD (gastroesophageal reflux disease)   Subjective: Feels fine. No issues  Objective:  Vital Signs: Filed Vitals:   01/22/13 1018 01/22/13 1353 01/22/13 2155 01/23/13 0154  BP: 113/70 133/87 133/87 118/80  Pulse: 79 79 96 103  Temp: 97.5 F (36.4 C) 97.7 F (36.5 C) 98.3 F (36.8 C) 98.5 F (36.9 C)  TempSrc: Oral Oral Oral Oral  Resp: 18 18 18 19   Height:      Weight:      SpO2: 94% 96% 95% 95%     EXAM: Calm. Pleasant.    Intake/Output Summary (Last 24 hours) at 01/23/13 0455 Last data filed at 01/23/13 0154  Gross per 24 hour  Intake   1080 ml  Output      0 ml  Net   1080 ml    Lab Results:  Recent Labs  01/21/13 0455 01/22/13 0810  NA 134* 138  K 3.3* 3.7  CL 99 100  CO2 25 28  GLUCOSE 114* 92  BUN 5* 4*  CREATININE 0.87 0.89  CALCIUM 9.2 9.4    Recent Labs  01/20/13 1440  AST 13  ALT 24  ALKPHOS 111  BILITOT 0.8  PROT 8.0  ALBUMIN 4.9   No results found for this basename: LIPASE, AMYLASE,  in the last 72 hours  Recent Labs  01/20/13 1440 01/21/13 0455  WBC 15.3* 7.4  HGB 16.0 14.0  HCT 45.0 40.3  MCV 85.1 85.4  PLT 310 223   No results found for this basename: CKTOTAL, CKMB, CKMBINDEX, TROPONINI,  in the last 72 hours No components found with this basename: POCBNP,  No results found for this basename: DDIMER,  in the last 72 hours No results found for this basename: HGBA1C,  in the last 72 hours No results found for this basename: CHOL, HDL, LDLCALC, TRIG, CHOLHDL, LDLDIRECT,  in the last 72 hours No results found for this basename: TSH, T4TOTAL, FREET3, T3FREE, THYROIDAB,  in the last 72 hours No results found  for this basename: VITAMINB12, FOLATE, FERRITIN, TIBC, IRON, RETICCTPCT,  in the last 72 hours  Studies/Results: No results found. Medications: Medications administered in the last 24 hours reviewed.  Current Medication List reviewed.    LOS: 3 days   [facility] Internal Medicine @ [facility] [phone]) 01/23/2013, 4:55 AM

## 2013-01-23 NOTE — Progress Notes (Signed)
Called report to Nicut at [facility]. Left message to call if had any additional questions.

## 2013-01-23 NOTE — Progress Notes (Signed)
Clinical Social Work  [facility] returned call and reports that they will transport patient. Sheriff will call unit when they are on their way to transport patient. CSW is signing off but available if further needs arise.  [place], [place] [phone]
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
## 2013-01-23 NOTE — Progress Notes (Signed)
Clinical Social Work  Patient accepted to [facility] by Dr. Wendall Stade. RN to call report to [phone]. CSW informed patient, mom and RN of dc and all parties agreeable. Patient understanding that [facility] will transport due to being under IVC. CSW called Sheriff and left a message regarding transportation needs. CSW will await a return call.  [place], [place] [phone]

## 2014-11-23 ENCOUNTER — Emergency Department (HOSPITAL_COMMUNITY)
Admission: EM | Admit: 2014-11-23 | Discharge: 2014-11-24 | Disposition: A | Discharge status: Other Acute Inpatient Hospital | Payer: Medicare Other | Attending: Emergency Medicine | Admitting: Emergency Medicine

## 2014-11-23 ENCOUNTER — Encounter (HOSPITAL_COMMUNITY): Payer: Self-pay | Admitting: Emergency Medicine

## 2014-11-23 DIAGNOSIS — M459 Ankylosing spondylitis of unspecified sites in spine: Secondary | ICD-10-CM | POA: Insufficient documentation

## 2014-11-23 DIAGNOSIS — F1310 Sedative, hypnotic or anxiolytic abuse, uncomplicated: Secondary | ICD-10-CM | POA: Insufficient documentation

## 2014-11-23 DIAGNOSIS — R079 Chest pain, unspecified: Secondary | ICD-10-CM | POA: Diagnosis not present

## 2014-11-23 DIAGNOSIS — F1210 Cannabis abuse, uncomplicated: Secondary | ICD-10-CM | POA: Insufficient documentation

## 2014-11-23 DIAGNOSIS — F312 Bipolar disorder, current episode manic severe with psychotic features: Secondary | ICD-10-CM | POA: Insufficient documentation

## 2014-11-23 DIAGNOSIS — F3113 Bipolar disorder, current episode manic without psychotic features, severe: Secondary | ICD-10-CM | POA: Diagnosis present

## 2014-11-23 DIAGNOSIS — F1910 Other psychoactive substance abuse, uncomplicated: Secondary | ICD-10-CM

## 2014-11-23 DIAGNOSIS — Z79899 Other long term (current) drug therapy: Secondary | ICD-10-CM | POA: Insufficient documentation

## 2014-11-23 DIAGNOSIS — M542 Cervicalgia: Secondary | ICD-10-CM | POA: Insufficient documentation

## 2014-11-23 DIAGNOSIS — Z87891 Personal history of nicotine dependence: Secondary | ICD-10-CM | POA: Diagnosis not present

## 2014-11-23 DIAGNOSIS — Z046 Encounter for general psychiatric examination, requested by authority: Secondary | ICD-10-CM | POA: Diagnosis present

## 2014-11-23 LAB — COMPREHENSIVE METABOLIC PANEL
ALK PHOS: 57 U/L (ref 39–117)
ALT: 18 U/L (ref 0–53)
AST: 13 U/L (ref 0–37)
Albumin: 4.5 g/dL (ref 3.5–5.2)
Anion gap: 5 (ref 5–15)
BILIRUBIN TOTAL: 0.9 mg/dL (ref 0.3–1.2)
BUN: 7 mg/dL (ref 6–23)
CALCIUM: 9.3 mg/dL (ref 8.4–10.5)
CHLORIDE: 108 mmol/L (ref 96–112)
CO2: 29 mmol/L (ref 19–32)
Creatinine, Ser: 0.69 mg/dL (ref 0.50–1.35)
GLUCOSE: 93 mg/dL (ref 70–99)
POTASSIUM: 4 mmol/L (ref 3.5–5.1)
Sodium: 142 mmol/L (ref 135–145)
Total Protein: 7.1 g/dL (ref 6.0–8.3)

## 2014-11-23 LAB — RAPID URINE DRUG SCREEN, HOSP PERFORMED
AMPHETAMINES: NOT DETECTED
Barbiturates: NOT DETECTED
Benzodiazepines: POSITIVE — AB
Cocaine: NOT DETECTED
OPIATES: NOT DETECTED
Tetrahydrocannabinol: POSITIVE — AB

## 2014-11-23 LAB — CBC
HCT: 41.6 % (ref 39.0–52.0)
Hemoglobin: 14 g/dL (ref 13.0–17.0)
MCH: 30.1 pg (ref 26.0–34.0)
MCHC: 33.7 g/dL (ref 30.0–36.0)
MCV: 89.5 fL (ref 78.0–100.0)
Platelets: 227 10*3/uL (ref 150–400)
RBC: 4.65 MIL/uL (ref 4.22–5.81)
RDW: 13.6 % (ref 11.5–15.5)
WBC: 6 10*3/uL (ref 4.0–10.5)

## 2014-11-23 LAB — SALICYLATE LEVEL: Salicylate Lvl: <4 mg/dL (ref 2.8–20.0)

## 2014-11-23 LAB — TROPONIN I: Troponin I: <0.03 ng/mL (ref <0.031)

## 2014-11-23 LAB — ETHANOL: Alcohol, Ethyl (B): 69 mg/dL — ABNORMAL HIGH (ref 0–9)

## 2014-11-23 LAB — ACETAMINOPHEN LEVEL

## 2014-11-23 MED ORDER — LORAZEPAM 1 MG PO TABS: 1.0000 mg | ORAL_TABLET | Freq: Three times a day (TID) | ORAL | Status: DC | PRN

## 2014-11-23 MED ORDER — ACETAMINOPHEN 325 MG PO TABS: 650.0000 mg | ORAL_TABLET | ORAL | Status: DC | PRN

## 2014-11-23 MED ORDER — IBUPROFEN 200 MG PO TABS
600.0000 mg | ORAL_TABLET | Freq: Three times a day (TID) | ORAL | Status: DC | PRN
Start: 2014-11-23 — End: 2014-11-24

## 2014-11-23 NOTE — BH Assessment (Signed)
Attempted to contact petitioner, Sharman CrateLaurie Arena, NP. Left a VM requesting a call back.   Clista BernhardtNancy Saahir Prude, [facility] Triage Specialist 11/23/2014 10:09 PM
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
## 2014-11-23 NOTE — ED Provider Notes (Signed)
CSN: [id number]     Arrival date & time 11/23/14  1859 History   First MD Initiated Contact with Patient 11/23/14 2002     Chief Complaint  Patient presents with  . Manic Behavior     (Consider location/radiation/quality/duration/timing/severity/associated sxs/prior Treatment) HPI  39 year old male with a prior history of schizophrenia presents in sheriff's custody with IVC papers. His act team drew up the IVC papers. Patient according to them, has been manic, agitated, and loosely threatening staff. He is also been exhibiting odd behavior such as wearing. Its and leather shoes at home while planning a trip to [place]. He has been thinking that his treatment team is withholding money from him has been verbally and physically aggressive to them. Patient denies the symptoms and states that he is normal and has no psychiatric issues. He states he's been taking his psychiatry medicines as prescribed. Patient states he does have multiple chronic complaints such as chronic neck and back pain and been going on for 10 years as well as daily chest pain for the past 1-2 years. He states he's had recent stress test and workups at outside hospitals. No current chest pain.  Past Medical History  Diagnosis Date  . Schizophrenia, paranoid type    Past Surgical History  Procedure Laterality Date  . Cholecystectomy    . Orbital fracture surgery     Family History  Problem Relation Age of Onset  . Mental illness Father    History  Substance Use Topics  . Smoking status: Former Games developer  . Smokeless tobacco: Never Used  . Alcohol Use: Yes     Comment: "A little bit"; Binge drinking large sums per his mother.  Unclear frequency.    Review of Systems  Constitutional: Negative for fever.  Cardiovascular: Positive for chest pain.  Gastrointestinal: Negative for vomiting.  Musculoskeletal: Positive for back pain and neck pain. Negative for neck stiffness.  Neurological: Negative for headaches.   Psychiatric/Behavioral: Negative for suicidal ideas, hallucinations and self-injury.  All other systems reviewed and are negative.     Allergies  Review of patient's allergies indicates no known allergies.  Home Medications   Prior to Admission medications   Medication Sig Start Date End Date Taking? Authorizing Provider  acetaminophen-codeine (TYLENOL #3) 300-30 MG per tablet Take 1 tablet by mouth every 4 (four) hours as needed for moderate pain or severe pain (pain).   Yes Historical Provider, MD  benztropine (COGENTIN) 0.5 MG tablet Take 0.5 mg by mouth at bedtime.    Yes Historical Provider, MD  divalproex (DEPAKOTE) 500 MG DR tablet Take 500 mg by mouth 3 (three) times daily.    Yes Historical Provider, MD  LORazepam (ATIVAN) 1 MG tablet Take 1 tablet (1 mg total) by mouth every 8 (eight) hours as needed for anxiety (agitation). 01/22/13  Yes Darnelle Bos, MD  acetaminophen (TYLENOL) 325 MG tablet Take 2 tablets (650 mg total) by mouth every 4 (four) hours as needed. 01/22/13   Darnelle Bos, MD  nicotine (NICODERM CQ - DOSED IN MG/24 HOURS) 21 mg/24hr patch Place 30 patches onto the skin daily. Patient not taking: Reported on 11/23/2014 01/22/13   Darnelle Bos, MD   BP 110/81 mmHg  Pulse 76  Temp(Src) 98.3 F (36.8 C) (Oral)  Resp 18  SpO2 98% Physical Exam  Constitutional: He is oriented to person, place, and time. He appears well-developed and well-nourished.  HENT:  Head: Normocephalic and atraumatic.  Right Ear: External ear normal.  Left Ear: External ear normal.  Nose: Nose normal.  Eyes: Right eye exhibits no discharge. Left eye exhibits no discharge.  Neck: Normal range of motion. Neck supple. No spinous process tenderness and no muscular tenderness present. No rigidity.  Cardiovascular: Normal rate, regular rhythm, normal heart sounds and intact distal pulses.   Pulmonary/Chest: Effort normal and breath sounds normal.  Abdominal: Soft. There  is no tenderness.  Musculoskeletal: He exhibits no edema.       Cervical back: He exhibits no tenderness.       Thoracic back: He exhibits no tenderness.       Lumbar back: He exhibits no tenderness.  Neurological: He is alert and oriented to person, place, and time.  Skin: Skin is warm and dry.  Nursing note and vitals reviewed.   ED Course  Procedures (including critical care time) Labs Review Labs Reviewed  ACETAMINOPHEN LEVEL - Abnormal; Notable for the following:    Acetaminophen (Tylenol), Serum <10.0 (*)    All other components within normal limits  ETHANOL - Abnormal; Notable for the following:    Alcohol, Ethyl (B) 69 (*)    All other components within normal limits  URINE RAPID DRUG SCREEN (HOSP PERFORMED) - Abnormal; Notable for the following:    Benzodiazepines POSITIVE (*)    Tetrahydrocannabinol POSITIVE (*)    All other components within normal limits  CBC  COMPREHENSIVE METABOLIC PANEL  SALICYLATE LEVEL  TROPONIN I    Imaging Review No results found.   EKG Interpretation None      MDM   Final diagnoses:  Schizophrenia, acute    Patient has been placed under IVC by his act team. Patient appears manic at this time. He is not agitated, does not require acute antipsychotic treatment in the ER. Psych has evaluated patient and is currently looking for placement.    Audree CamelScott T Sadiel Mota, MD 11/23/14 [phone]

## 2014-11-23 NOTE — ED Notes (Signed)
Pt brought in by sheriff under IVC  Pt states the ACT team came to see him today  Pt does not like the ACT members that see him at home and feels like they are lying to him and keeping money from him  Pt is manic in behavior, talking rapidly, agitated but cooperative   Paperwork states pt is manic with impaired judgment, loud, rapid speech, euphoric, flight of ideas, delusional, made vague threats toward ACT staff.  Pt is schizophrenic with odd behavior including wearing full suits and leather shoes at home, [place] trip to [place]  [facility] treatment team is withholding money, verbally aggressive, cursing, and physically aggressive

## 2014-11-23 NOTE — BH Assessment (Signed)
Reviewed EDP note prior to initiating assessment as Dr. Criss AlvineGoldston was not reached by phone. Per note pt presents under IVC due to manic behaviors and aggression towards ACT. Hx of schizophrenia. Labs are pending.    Assessment to commence shortly.   Clista BernhardtNancy Jerek Meulemans, [facility] Triage Specialist 11/23/2014 9:34 PM

## 2014-11-24 ENCOUNTER — Inpatient Hospital Stay: Payer: Self-pay | Admitting: Psychiatry

## 2014-11-24 DIAGNOSIS — F3113 Bipolar disorder, current episode manic without psychotic features, severe: Secondary | ICD-10-CM

## 2014-11-24 DIAGNOSIS — F312 Bipolar disorder, current episode manic severe with psychotic features: Secondary | ICD-10-CM | POA: Diagnosis not present

## 2014-11-24 LAB — VALPROIC ACID LEVEL: Valproic Acid Lvl: <10 ug/mL — ABNORMAL LOW (ref 50.0–100.0)

## 2014-11-24 MED ORDER — DIVALPROEX SODIUM 500 MG PO DR TAB
500.0000 mg | DELAYED_RELEASE_TABLET | Freq: Three times a day (TID) | ORAL | Status: DC
  Administered 2014-11-24: 500 mg via ORAL
  Filled 2014-11-24: qty 1

## 2014-11-24 MED ORDER — BENZTROPINE MESYLATE 1 MG PO TABS: 0.5000 mg | ORAL_TABLET | Freq: Every day | ORAL | Status: DC

## 2014-11-24 MED ORDER — LORAZEPAM 1 MG PO TABS
1.0000 mg | ORAL_TABLET | Freq: Four times a day (QID) | ORAL | Status: DC | PRN
  Administered 2014-11-24: 1 mg via ORAL
  Filled 2014-11-24: qty 1

## 2014-11-24 MED ORDER — ARIPIPRAZOLE 5 MG PO TABS
5.0000 mg | ORAL_TABLET | Freq: Every day | ORAL | Status: DC
  Administered 2014-11-24: 5 mg via ORAL
  Filled 2014-11-24: qty 1

## 2014-11-24 NOTE — BH Assessment (Signed)
Dr. Jannifer FranklinAkintayo and Nanine MeansJamison Lord, DNP continue to recommend inpatient treatment. [facility] at [facility]. TTS to seek alternative placement.   Patient faxed to the following facilities for [facility]: [facility], [address], [address] [facility] [facility] [place], [facility], [address] ([address] [place]), [facility] [place] [address] [place], [address], [address], [address], [place], [place], [place], [address], [facility], and [facility]	[place].

## 2014-11-24 NOTE — BH Assessment (Signed)
Per Olivia Mackieressa, [facility] by Dr. Ardyth HarpsHernandez. Nursing report # [phone]. Room 312. Patient awaiting sheriff transport.

## 2014-11-24 NOTE — ED Notes (Signed)
[facility]. He left in no acute distress.
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
## 2014-11-24 NOTE — BH Assessment (Signed)
Inpt recommended. [facility]. TTS seeking placement.  [facility]  [place]  [name], [place] Triage Specialist 11/24/2014 1:08 AM

## 2014-11-28 LAB — LIPID PANEL
Cholesterol: 138 mg/dL (ref 0–200)
HDL Cholesterol: 37 mg/dL — ABNORMAL LOW (ref 40–60)
Ldl Cholesterol, Calc: 67 mg/dL (ref 0–100)
Triglycerides: 171 mg/dL (ref 0–200)
VLDL Cholesterol, Calc: 34 mg/dL (ref 5–40)

## 2014-11-28 LAB — HEMOGLOBIN A1C: Hemoglobin A1C: 5.2 % (ref 4.2–6.3)
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
## 2016-11-04 ENCOUNTER — Encounter (HOSPITAL_COMMUNITY): Payer: Self-pay | Admitting: Emergency Medicine

## 2016-11-04 ENCOUNTER — Emergency Department (HOSPITAL_COMMUNITY)
Admission: EM | Admit: 2016-11-04 | Discharge: 2016-11-06 | Disposition: A | Discharge status: Other Acute Inpatient Hospital | Payer: Medicare (Managed Care) | Attending: Emergency Medicine | Admitting: Emergency Medicine

## 2016-11-04 DIAGNOSIS — Z818 Family history of other mental and behavioral disorders: Secondary | ICD-10-CM | POA: Diagnosis not present

## 2016-11-04 DIAGNOSIS — T1491XA Suicide attempt, initial encounter: Secondary | ICD-10-CM | POA: Diagnosis present

## 2016-11-04 DIAGNOSIS — F1410 Cocaine abuse, uncomplicated: Secondary | ICD-10-CM | POA: Diagnosis not present

## 2016-11-04 DIAGNOSIS — Z79899 Other long term (current) drug therapy: Secondary | ICD-10-CM | POA: Diagnosis not present

## 2016-11-04 DIAGNOSIS — Z87891 Personal history of nicotine dependence: Secondary | ICD-10-CM | POA: Insufficient documentation

## 2016-11-04 DIAGNOSIS — Y939 Activity, unspecified: Secondary | ICD-10-CM | POA: Diagnosis not present

## 2016-11-04 DIAGNOSIS — Y999 Unspecified external cause status: Secondary | ICD-10-CM | POA: Diagnosis not present

## 2016-11-04 DIAGNOSIS — F312 Bipolar disorder, current episode manic severe with psychotic features: Secondary | ICD-10-CM | POA: Insufficient documentation

## 2016-11-04 DIAGNOSIS — F3113 Bipolar disorder, current episode manic without psychotic features, severe: Secondary | ICD-10-CM | POA: Diagnosis not present

## 2016-11-04 DIAGNOSIS — X79XXXA Intentional self-harm by blunt object, initial encounter: Secondary | ICD-10-CM | POA: Diagnosis not present

## 2016-11-04 DIAGNOSIS — Y929 Unspecified place or not applicable: Secondary | ICD-10-CM | POA: Diagnosis not present

## 2016-11-04 LAB — RAPID URINE DRUG SCREEN, HOSP PERFORMED
Amphetamines: NOT DETECTED
Barbiturates: NOT DETECTED
Benzodiazepines: NOT DETECTED
Cocaine: POSITIVE — AB
Opiates: NOT DETECTED
Tetrahydrocannabinol: NOT DETECTED

## 2016-11-04 LAB — COMPREHENSIVE METABOLIC PANEL
ALT: 14 U/L — ABNORMAL LOW (ref 17–63)
AST: 9 U/L — ABNORMAL LOW (ref 15–41)
Albumin: 4 g/dL (ref 3.5–5.0)
Alkaline Phosphatase: 96 U/L (ref 38–126)
Anion gap: 8 (ref 5–15)
BUN: 10 mg/dL (ref 6–20)
CO2: 26 mmol/L (ref 22–32)
Calcium: 8.5 mg/dL — ABNORMAL LOW (ref 8.9–10.3)
Chloride: 105 mmol/L (ref 101–111)
Creatinine, Ser: 0.6 mg/dL — ABNORMAL LOW (ref 0.61–1.24)
GFR calc Af Amer: >60 mL/min (ref >60)
GFR calc non Af Amer: >60 mL/min (ref >60)
Glucose, Bld: 91 mg/dL (ref 65–99)
Potassium: 3.6 mmol/L (ref 3.5–5.1)
Sodium: 139 mmol/L (ref 135–145)
Total Bilirubin: 0.6 mg/dL (ref 0.3–1.2)
Total Protein: 6.9 g/dL (ref 6.5–8.1)

## 2016-11-04 LAB — ETHANOL: Alcohol, Ethyl (B): 5 mg/dL — ABNORMAL HIGH (ref <5)

## 2016-11-04 LAB — CBC
HCT: 36.5 % — ABNORMAL LOW (ref 39.0–52.0)
Hemoglobin: 12.2 g/dL — ABNORMAL LOW (ref 13.0–17.0)
MCH: 28.6 pg (ref 26.0–34.0)
MCHC: 33.4 g/dL (ref 30.0–36.0)
MCV: 85.7 fL (ref 78.0–100.0)
Platelets: 257 10*3/uL (ref 150–400)
RBC: 4.26 MIL/uL (ref 4.22–5.81)
RDW: 15.3 % (ref 11.5–15.5)
WBC: 7.9 10*3/uL (ref 4.0–10.5)

## 2016-11-04 LAB — ACETAMINOPHEN LEVEL: Acetaminophen (Tylenol), Serum: <10 ug/mL — ABNORMAL LOW (ref 10–30)

## 2016-11-04 LAB — SALICYLATE LEVEL: Salicylate Lvl: <7 mg/dL (ref 2.8–30.0)

## 2016-11-04 MED ORDER — LORAZEPAM 1 MG PO TABS
1.0000 mg | ORAL_TABLET | Freq: Three times a day (TID) | ORAL | Status: DC | PRN
  Administered 2016-11-05: 1 mg via ORAL
  Filled 2016-11-04: qty 1

## 2016-11-04 MED ORDER — ACETAMINOPHEN 325 MG PO TABS
650.0000 mg | ORAL_TABLET | ORAL | Status: DC | PRN
  Administered 2016-11-05: 650 mg via ORAL
  Filled 2016-11-04: qty 2

## 2016-11-04 NOTE — ED Notes (Signed)
Pt belongings locked up in locker 30

## 2016-11-04 NOTE — ED Notes (Signed)
Pt's mom, Freddie ApleyChris Mcvicker : [phone]

## 2016-11-04 NOTE — ED Notes (Signed)
Bed: WA30 Expected date:  Expected time:  Means of arrival:  Comments: 

## 2016-11-04 NOTE — BH Assessment (Signed)
Attempted tele-assessment. Pt is too drowsy to respond to questions. TTS will complete assessment when Pt is able to participate.   Harlin RainFord Ellis Patsy BaltimoreWarrick Jr, LPC, [facility], [facility] Triage Specialist [phone]
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
## 2016-11-04 NOTE — ED Triage Notes (Signed)
Pt here with his mother. Pt's [place]and [place] on and off). Pt called his mother from the bus stop asking to stay with her. Per pt's mother "it is not safe for pt to stay there" so pt can not stay there. Pt endorses SI and states he attempted to end his life "one time years ago". Pt states he "hits his head to hurt himself". Pt is cooperative at time of assessment, but appears to be agitated. Pt has hx of paranoid schizophrenia. Pt has not been taking medications since Feb when he was kicked out [place] his [place]

## 2016-11-05 DIAGNOSIS — F1410 Cocaine abuse, uncomplicated: Secondary | ICD-10-CM

## 2016-11-05 LAB — I-STAT TROPONIN, ED: Troponin i, poc: 0 ng/mL (ref 0.00–0.08)

## 2016-11-05 MED ORDER — RISPERIDONE 1 MG PO TABS
1.0000 mg | ORAL_TABLET | Freq: Two times a day (BID) | ORAL | Status: DC
  Administered 2016-11-05 – 2016-11-06 (×3): 1 mg via ORAL
  Filled 2016-11-05 (×3): qty 1

## 2016-11-05 MED ORDER — NICOTINE 21 MG/24HR TD PT24
21.0000 mg | MEDICATED_PATCH | Freq: Every day | TRANSDERMAL | Status: DC
  Administered 2016-11-05 – 2016-11-06 (×2): 21 mg via TRANSDERMAL
  Filled 2016-11-05 (×2): qty 1

## 2016-11-05 MED ORDER — GABAPENTIN 100 MG PO CAPS
200.0000 mg | ORAL_CAPSULE | Freq: Two times a day (BID) | ORAL | Status: DC
  Administered 2016-11-05 – 2016-11-06 (×3): 200 mg via ORAL
  Filled 2016-11-05 (×3): qty 2

## 2016-11-05 NOTE — ED Notes (Signed)
Pt c/o "heart pain that has been going on for 10 years", denies SOB or any other s/s, states that he had "[facility] EDP notified, new orders placed for EKG and troponin.

## 2016-11-05 NOTE — ED Notes (Signed)
TTS consult in progress. °

## 2016-11-05 NOTE — Progress Notes (Signed)
CSW received call from [facility] requesting IVC paperwork to be complete; however, they will not have availably until 1/17. CSW will update NP and disposition.   Stacy GardnerErin Wilma Michaelson, LCSWA Clinical Social Worker [phone]

## 2016-11-05 NOTE — BH Assessment (Signed)
[facility] Assessment Progress Note  Per Thedore MinsMojeed Akintayo, MD, this pt requires psychiatric hospitalization at this time.  The following facilities have been contacted to seek placement for this pt, with results as noted:  Beds available, information sent, decision pending:  [facility]   At capacity:  [facility]   Camilla Skeen, [place] Triage Specialist [phone]

## 2016-11-05 NOTE — ED Notes (Signed)
Gave patients belongings to his mother, Olen CordialKris Janice. Patient wanted her to take his belongings to wash them. Also, updated mother of patients condition with patients permission.
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
## 2016-11-06 DIAGNOSIS — F3113 Bipolar disorder, current episode manic without psychotic features, severe: Secondary | ICD-10-CM

## 2016-11-06 DIAGNOSIS — R45851 Suicidal ideations: Secondary | ICD-10-CM

## 2016-11-06 DIAGNOSIS — Z87891 Personal history of nicotine dependence: Secondary | ICD-10-CM | POA: Diagnosis not present

## 2016-11-06 DIAGNOSIS — F1410 Cocaine abuse, uncomplicated: Secondary | ICD-10-CM | POA: Diagnosis not present

## 2016-11-06 DIAGNOSIS — Z79899 Other long term (current) drug therapy: Secondary | ICD-10-CM

## 2016-11-06 DIAGNOSIS — Z818 Family history of other mental and behavioral disorders: Secondary | ICD-10-CM | POA: Diagnosis not present

## 2016-11-06 MED ORDER — BENZTROPINE MESYLATE 1 MG PO TABS
2.0000 mg | ORAL_TABLET | Freq: Once | ORAL | Status: AC
  Administered 2016-11-06: 2 mg via ORAL
  Filled 2016-11-06: qty 2

## 2016-11-06 MED ORDER — SALINE SPRAY 0.65 % NA SOLN: 1.0000 | Freq: Once | NASAL | Status: DC

## 2016-11-06 MED ORDER — HYDROXYZINE HCL 25 MG PO TABS
25.0000 mg | ORAL_TABLET | Freq: Once | ORAL | Status: AC
  Administered 2016-11-06: 25 mg via ORAL
  Filled 2016-11-06: qty 1

## 2016-11-06 MED ORDER — HYDROXYZINE HCL 25 MG PO TABS
25.0000 mg | ORAL_TABLET | Freq: Three times a day (TID) | ORAL | Status: DC | PRN
  Administered 2016-11-06: 25 mg via ORAL
  Filled 2016-11-06: qty 1

## 2016-11-06 MED ORDER — SALINE SPRAY 0.65 % NA SOLN
1.0000 | NASAL | Status: DC | PRN
  Administered 2016-11-06: 1 via NASAL
  Filled 2016-11-06: qty 44

## 2016-11-06 MED ORDER — OLANZAPINE 10 MG PO TABS: 10.0000 mg | ORAL_TABLET | Freq: Every day | ORAL | Status: DC

## 2016-11-06 NOTE — ED Notes (Signed)
Pt taking a shower 

## 2016-11-06 NOTE — Consult Note (Signed)
[place] Psychiatry Consult   Reason for Consult:  Hallucinations, cocaine abuse, suicide plan Referring Physician:  EDP Patient Identification: [name] MRN:  [id number] Principal Diagnosis: Bipolar affective disorder, manic, severe ([place]) Diagnosis:   Patient Active Problem List   Diagnosis Date Noted  . Bipolar affective disorder, manic, severe ([place]) [F31.13] 11/24/2014    Priority: High  . Cocaine abuse, continuous [F14.10] 11/05/2016  . Hypokalemia [E87.6] 01/21/2013  . GERD (gastroesophageal reflux disease) [K21.9] 01/21/2013  . Toxic encephalopathy from drug ingestion in the setting of paranoid schizophrenia  [G92] 01/20/2013  . Polysubstance abuse [F19.10] 01/20/2013  . Laceration left index finger [IMO0002] 01/20/2013    Total Time spent with patient: 45 minutes  Subjective:   [name] is a 41 y.o. male patient admitted with suicide threat and hallucinations.  HPI:  41 yo male who presented to the ED after abusing cocaine and having an increase in hallucinations with suicidal ideations.  He reports he had to leave his apartment a couple of days ago and is now homeless.  [name] states he has not been taking his medications and the voices started telling him to kill himself, burned himself in the past as a suicide attempt.  No homicidal ideations today or withdrawal symptoms.  Past Psychiatric History:  Bipolar disorder, substance abuse  Risk to Self: Suicidal Ideation: Yes-Currently Present Suicidal Intent: No Is patient at risk for suicide?: Yes (hears voices that tell him to hurt himself ) Suicidal Plan?: No Access to Means: No What has been your use of drugs/alcohol within the last 12 months?: reports to using cocaine and alcohol recently  How many times?:  (multiple) Triggers for Past Attempts: Hallucinations Intentional Self Injurious Behavior: Burning, Bruising Comment - Self Injurious Behavior: pt reports he punches himself in the head repeatedly and has  burned himself many times  Risk to Others: Homicidal Ideation: No Thoughts of Harm to Others: Yes-Currently Present Comment - Thoughts of Harm to Others: pt reports he hears voices that tell him to hurt others Current Homicidal Intent: No Current Homicidal Plan: No Access to Homicidal Means: No History of harm to others?: Yes Assessment of Violence: In distant past Violent Behavior Description: pt stated "I can't talk about that" Does patient have access to weapons?: Yes (Comment) (reports he has access to guns ) Criminal Charges Pending?: No Does patient have a court date: No Prior Inpatient Therapy: Prior Inpatient Therapy: Yes Prior Therapy Dates: multiple Prior Therapy Facilty/Provider(s): multiple Reason for Treatment: schizophrenia Prior Outpatient Therapy: Prior Outpatient Therapy: Yes Prior Therapy Dates: pt stated "I have no clue" Prior Therapy Facilty/Provider(s): unknown Reason for Treatment: schizophrenia Does patient have an ACCT team?: No Does patient have Intensive In-House Services?  : No Does patient have [facility] services? : No Does patient have P4CC services?: No  Past Medical History:  Past Medical History:  Diagnosis Date  . Schizophrenia, paranoid type [facility])     Past Surgical History:  Procedure Laterality Date  . CHOLECYSTECTOMY    . ORBITAL FRACTURE SURGERY     Family History:  Family History  Problem Relation Age of Onset  . Mental illness Father    Family Psychiatric  History: none Social History:  History  Alcohol Use  . Yes    Comment: "A little bit"; Binge drinking large sums per his mother.  Unclear frequency.     History  Drug Use  . Types: Other-see comments, Cocaine    Comment: former    Social History  Social History  . Marital status: Single    Spouse name: N/A  . Number of children: 0  . Years of education: N/A   Occupational History  . Worked at [employer].    Social History Main Topics  . Smoking status: Former  Research scientist (life sciences)  . Smokeless tobacco: Never Used  . Alcohol use Yes     Comment: "A little bit"; Binge drinking large sums per his mother.  Unclear frequency.  . Drug use:     Types: Other-see comments, Cocaine     Comment: former  . Sexual activity: Not Asked     Comment: not assessed   Other Topics Concern  . None   Social History Narrative   Lives with his mother.  Single.  No children.  Lost his job at [employer] approximately 1 month ago.   Additional Social History:    Allergies:  No Known Allergies  Labs:  Results for orders placed or performed during the hospital encounter of 11/04/16 (from the past 48 hour(s))  Comprehensive metabolic panel     Status: Abnormal   Collection Time: 11/04/16  7:40 PM  Result Value Ref Range   Sodium 139 135 - 145 mmol/L   Potassium 3.6 3.5 - 5.1 mmol/L   Chloride 105 101 - 111 mmol/L   CO2 26 22 - 32 mmol/L   Glucose, Bld 91 65 - 99 mg/dL   BUN 10 6 - 20 mg/dL   Creatinine, Ser 0.60 (L) 0.61 - 1.24 mg/dL   Calcium 8.5 (L) 8.9 - 10.3 mg/dL   Total Protein 6.9 6.5 - 8.1 g/dL   Albumin 4.0 3.5 - 5.0 g/dL   AST 9 (L) 15 - 41 U/L   ALT 14 (L) 17 - 63 U/L   Alkaline Phosphatase 96 38 - 126 U/L   Total Bilirubin 0.6 0.3 - 1.2 mg/dL   GFR calc non Af Amer >60 >60 mL/min   GFR calc Af Amer >60 >60 mL/min    Comment: (NOTE) The eGFR has been calculated using the CKD EPI equation. This calculation has not been validated in all clinical situations. eGFR's persistently <60 mL/min signify possible Chronic Kidney Disease.    Anion gap 8 5 - 15  Ethanol     Status: Abnormal   Collection Time: 11/04/16  7:40 PM  Result Value Ref Range   Alcohol, Ethyl (B) 5 (H) <5 mg/dL    Comment:        LOWEST DETECTABLE LIMIT FOR SERUM ALCOHOL IS 5 mg/dL FOR MEDICAL PURPOSES ONLY   Salicylate level     Status: None   Collection Time: 11/04/16  7:40 PM  Result Value Ref Range   Salicylate Lvl <1.8 2.8 - 30.0 mg/dL  Acetaminophen level     Status: Abnormal    Collection Time: 11/04/16  7:40 PM  Result Value Ref Range   Acetaminophen (Tylenol), Serum <10 (L) 10 - 30 ug/mL    Comment:        THERAPEUTIC CONCENTRATIONS VARY SIGNIFICANTLY. A RANGE OF 10-30 ug/mL MAY BE AN EFFECTIVE CONCENTRATION FOR MANY PATIENTS. HOWEVER, SOME ARE BEST TREATED AT CONCENTRATIONS OUTSIDE THIS RANGE. ACETAMINOPHEN CONCENTRATIONS >150 ug/mL AT 4 HOURS AFTER INGESTION AND >50 ug/mL AT 12 HOURS AFTER INGESTION ARE OFTEN ASSOCIATED WITH TOXIC REACTIONS.   cbc     Status: Abnormal   Collection Time: 11/04/16  7:40 PM  Result Value Ref Range   WBC 7.9 4.0 - 10.5 K/uL   RBC 4.26 4.22 -  5.81 MIL/uL   Hemoglobin 12.2 (L) 13.0 - 17.0 g/dL   HCT 36.5 (L) 39.0 - 52.0 %   MCV 85.7 78.0 - 100.0 fL   MCH 28.6 26.0 - 34.0 pg   MCHC 33.4 30.0 - 36.0 g/dL   RDW 15.3 11.5 - 15.5 %   Platelets 257 150 - 400 K/uL  Rapid urine drug screen (hospital performed)     Status: Abnormal   Collection Time: 11/04/16  8:17 PM  Result Value Ref Range   Opiates NONE DETECTED NONE DETECTED   Cocaine POSITIVE (A) NONE DETECTED   Benzodiazepines NONE DETECTED NONE DETECTED   Amphetamines NONE DETECTED NONE DETECTED   Tetrahydrocannabinol NONE DETECTED NONE DETECTED   Barbiturates NONE DETECTED NONE DETECTED    Comment:        DRUG SCREEN FOR MEDICAL PURPOSES ONLY.  IF CONFIRMATION IS NEEDED FOR ANY PURPOSE, NOTIFY LAB WITHIN 5 DAYS.        LOWEST DETECTABLE LIMITS FOR URINE DRUG SCREEN Drug Class       Cutoff (ng/mL) Amphetamine      1000 Barbiturate      200 Benzodiazepine   767 Tricyclics       209 Opiates          300 Cocaine          300 THC              50   I-Stat Troponin, ED (not at [facility])     Status: None   Collection Time: 11/05/16  3:07 PM  Result Value Ref Range   Troponin i, poc 0.00 0.00 - 0.08 ng/mL   Comment 3            Comment: Due to the release kinetics of cTnI, a negative result within the first hours of the onset of symptoms does not rule  out myocardial infarction with certainty. If myocardial infarction is still suspected, repeat the test at appropriate intervals.     Current Facility-Administered Medications  Medication Dose Route Frequency Provider Last Rate Last Dose  . acetaminophen (TYLENOL) tablet 650 mg  650 mg Oral Q4H PRN Ozella Almond Ward, PA-C   650 mg at 11/05/16 4709  . gabapentin (NEURONTIN) capsule 200 mg  200 mg Oral BID Corena Pilgrim, MD   200 mg at 11/06/16 0947  . hydrOXYzine (ATARAX/VISTARIL) tablet 25 mg  25 mg Oral TID PRN Corena Pilgrim, MD      . nicotine (NICODERM CQ - dosed in mg/24 hours) patch 21 mg  21 mg Transdermal Daily Corena Pilgrim, MD   21 mg at 11/06/16 0948  . OLANZapine (ZYPREXA) tablet 10 mg  10 mg Oral QHS Corena Pilgrim, MD       Current Outpatient Prescriptions  Medication Sig Dispense Refill  . acetaminophen (TYLENOL) 325 MG tablet Take 2 tablets (650 mg total) by mouth every 4 (four) hours as needed.    . benztropine (COGENTIN) 0.5 MG tablet Take 0.5 mg by mouth at bedtime.     . divalproex (DEPAKOTE) 500 MG DR tablet Take 500 mg by mouth 3 (three) times daily.     Marland Kitchen LORazepam (ATIVAN) 1 MG tablet Take 1 tablet (1 mg total) by mouth every 8 (eight) hours as needed for anxiety (agitation). 30 tablet   . nicotine (NICODERM CQ - DOSED IN MG/24 HOURS) 21 mg/24hr patch Place 30 patches onto the skin daily. (Patient not taking: Reported on 11/23/2014) 28 patch     Musculoskeletal: Strength &  Muscle Tone: within normal limits Gait & Station: normal Patient leans: N/A  Psychiatric Specialty Exam: Physical Exam  Constitutional: He is oriented to person, place, and time. He appears well-developed and well-nourished.  HENT:  Head: Normocephalic.  Neck: Normal range of motion.  Respiratory: Effort normal.  Musculoskeletal: Normal range of motion.  Neurological: He is alert and oriented to person, place, and time.  Psychiatric: His speech is normal. Judgment normal. He is  actively hallucinating. Cognition and memory are normal. He exhibits a depressed mood. He expresses suicidal ideation. He expresses suicidal plans.    Review of Systems  Psychiatric/Behavioral: Positive for depression, hallucinations, substance abuse and suicidal ideas.  All other systems reviewed and are negative.   Blood pressure 110/66, pulse 61, temperature 97.5 F (36.4 C), temperature source Oral, resp. rate 20, SpO2 99 %.There is no height or weight on file to calculate BMI.  General Appearance: Disheveled  Eye Contact:  Fair  Speech:  Normal Rate  Volume:  Normal  Mood:  Irritable  Affect:  Blunt  Thought Process:  Coherent and Descriptions of Associations: Intact  Orientation:  Full (Time, Place, and Person)  Thought Content:  Hallucinations: Auditory  Suicidal Thoughts:  Yes.  with intent/plan  Homicidal Thoughts:  No  Memory:  Immediate;   Fair Recent;   Fair Remote;   Fair  Judgement:  Impaired  Insight:  Fair  Psychomotor Activity:  Decreased  Concentration:  Concentration: Fair and Attention Span: Fair  Recall:  [employer] of Knowledge:  Fair  Language:  Good  Akathisia:  No  Handed:  Right  AIMS (if indicated):     Assets:  Leisure Time Physical Health Resilience  ADL's:  Intact  Cognition:  WNL  Sleep:        Treatment Plan Summary: Daily contact with patient to assess and evaluate symptoms and progress in treatment, Medication management and Plan bipolar affective disorder, derpessed, severe with psychosis:  -Crisis stabilization -Medication management:  Gabapentin 200 mg BID for mood and anxiety, Vistaril 25 mg TID PRN anxiety, and Zyprexa 10 mg at bedtime for psychosis started -Individual and substance abuse counseling -Transfer to [employer]  Disposition: Recommend psychiatric Inpatient admission when medically cleared.  Waylan Boga, NP 11/06/2016 12:08 PM  Patient seen face-to-face for psychiatric evaluation, chart reviewed and case discussed  with the physician extender and developed treatment plan. Reviewed the information documented and agree with the treatment plan. Corena Pilgrim, MD

## 2016-11-06 NOTE — ED Notes (Signed)
[facility] and AH telling him to hurt himself and others. Patient denies VH. Plan of care discussed. Patient voices no complaints or concerns at this time. Encouragement and support provided and safety maintain. Q 15 min safety checks in place and video monitoring.

## 2016-11-06 NOTE — ED Notes (Signed)
Pt up at nurses station, presents with anxiety. pt c/o difficulty breathing. No s/s of distress noted.Pt encouraged to and participated in deep breathing. SP02 100% RA, pulse 85.Pt now c/o nasal congestion. This nurse notified Jameson,NP.

## 2016-11-06 NOTE — [facility] Counselor (Signed)
Clinician received a call from [place] [facility] informing the pt's referral was received and will be review with staff psychiatrist.   Gwinda Passereylese D Bennett, MS, [facility], [facility] Triage Specialist [phone]

## 2016-11-06 NOTE — ED Notes (Signed)
Sheriff on unit to transfer pt to [facility] per MD order. Pt has no personal property. Transfer forms given to sheriff for transfer. This nurse notified [facility] is being [facility] at this time. Pt ambulatory off unit.

## 2016-11-06 NOTE — ED Notes (Signed)
Pt compliant with morning medication regimen. Pt endorsing SI. Pt endorsing AVH. Pt behavior calm and cooperative. Encouragement and support provided. Special checks q 15 mins in place for safety. Video monitoring in place. Will continue to monitor.

## 2016-11-06 NOTE — BH Assessment (Signed)
[facility] Assessment Progress Note  Per Thedore MinsMojeed Akintayo, MD, this pt requires psychiatric hospitalization at this time.  At 11:57, Nicholos JohnsKathleen calls from [facility] to report that pt has been accepted to their facility by Dr Daphane ShepherdMeyer, contigent upon pt being placed under IVC.  Dr Jannifer FranklinAkintayo concurs with this decision, and has initiated IVC.  [place] [place] [facility] [facility], and at 11:52, Etta QuillMagistrate Mariyanna Mucha confirms receipt.  As of this writing, service of Findings and Custody Order is pending.  Pt's nurse, Morrie Sheldonshley, has been notified, and agrees to call report to xxx.  [facility] [place] [facility]   Doylene Canninghomas Siniya Lichty, MA Triage Specialist [phone]

## 2016-11-06 NOTE — ED Notes (Signed)
Sheriff called for transport  

## 2016-11-06 NOTE — ED Notes (Signed)
This nurse notified [facility] [phone]; that pt is still awaiting sheriff transport. Asked to inform facility with updates/when transport arrives. This nurse will pass on to oncoming shift.

## 2016-11-06 NOTE — ED Notes (Signed)
Pt c/o anxiety pt reports that his tongue has "rolled back" a couple of times. No s/s of acute distress at this time. This nurse notified Jameson,NP.

## 2016-11-06 NOTE — Progress Notes (Signed)
11/06/16 1352:  LRT went to pt room to offer activities, pt was sleep.  Caroll RancherMarjette Treyten Monestime, LRT/CTRS
